# Patient Record
Sex: Male | Born: 1985 | Race: Black or African American | Hispanic: No | Marital: Single | Smoking: Never smoker
Health system: Southern US, Community
[De-identification: ages and names within clinical notes are randomized; demographics above are authoritative.]

## PROBLEM LIST (undated history)

## (undated) ENCOUNTER — Emergency Department (HOSPITAL_COMMUNITY): Payer: Self-pay | Source: Home / Self Care

## (undated) DIAGNOSIS — J45909 Unspecified asthma, uncomplicated: Secondary | ICD-10-CM

---

## 2018-02-01 ENCOUNTER — Other Ambulatory Visit: Payer: Self-pay

## 2018-02-01 ENCOUNTER — Emergency Department (HOSPITAL_COMMUNITY): Payer: Self-pay

## 2018-02-01 ENCOUNTER — Emergency Department (HOSPITAL_COMMUNITY)
Admission: EM | Admit: 2018-02-01 | Discharge: 2018-02-01 | Disposition: A | Discharge status: Home / Self Care | Payer: Self-pay | Attending: Emergency Medicine | Admitting: Emergency Medicine

## 2018-02-01 ENCOUNTER — Encounter (HOSPITAL_COMMUNITY): Payer: Self-pay | Admitting: Emergency Medicine

## 2018-02-01 DIAGNOSIS — K047 Periapical abscess without sinus: Secondary | ICD-10-CM | POA: Insufficient documentation

## 2018-02-01 DIAGNOSIS — J45909 Unspecified asthma, uncomplicated: Secondary | ICD-10-CM | POA: Insufficient documentation

## 2018-02-01 DIAGNOSIS — R6884 Jaw pain: Secondary | ICD-10-CM

## 2018-02-01 DIAGNOSIS — M542 Cervicalgia: Secondary | ICD-10-CM

## 2018-02-01 HISTORY — DX: Unspecified asthma, uncomplicated: J45.909

## 2018-02-01 LAB — CBC WITH DIFFERENTIAL/PLATELET
Abs Immature Granulocytes: 0.02 10*3/uL (ref 0.00–0.07)
Basophils Absolute: 0 10*3/uL (ref 0.0–0.1)
Basophils Relative: 1 %
Eosinophils Absolute: 0.2 10*3/uL (ref 0.0–0.5)
Eosinophils Relative: 3 %
HCT: 43.9 % (ref 39.0–52.0)
Hemoglobin: 14.7 g/dL (ref 13.0–17.0)
Immature Granulocytes: 0 %
Lymphocytes Relative: 20 %
Lymphs Abs: 1.2 10*3/uL (ref 0.7–4.0)
MCH: 26.7 pg (ref 26.0–34.0)
MCHC: 33.5 g/dL (ref 30.0–36.0)
MCV: 79.8 fL — ABNORMAL LOW (ref 80.0–100.0)
Monocytes Absolute: 0.6 10*3/uL (ref 0.1–1.0)
Monocytes Relative: 9 %
Neutro Abs: 4.1 10*3/uL (ref 1.7–7.7)
Neutrophils Relative %: 67 %
Platelets: 210 10*3/uL (ref 150–400)
RBC: 5.5 MIL/uL (ref 4.22–5.81)
RDW: 11.9 % (ref 11.5–15.5)
WBC: 6.1 10*3/uL (ref 4.0–10.5)
nRBC: 0 % (ref 0.0–0.2)

## 2018-02-01 LAB — BASIC METABOLIC PANEL
Anion gap: 10 (ref 5–15)
BUN: 23 mg/dL — ABNORMAL HIGH (ref 6–20)
CO2: 27 mmol/L (ref 22–32)
Calcium: 9.4 mg/dL (ref 8.9–10.3)
Chloride: 105 mmol/L (ref 98–111)
Creatinine, Ser: 1.13 mg/dL (ref 0.61–1.24)
GFR calc Af Amer: >60 mL/min (ref >60)
GFR calc non Af Amer: >60 mL/min (ref >60)
Glucose, Bld: 103 mg/dL — ABNORMAL HIGH (ref 70–99)
Potassium: 4.6 mmol/L (ref 3.5–5.1)
Sodium: 142 mmol/L (ref 135–145)

## 2018-02-01 MED ORDER — SODIUM CHLORIDE 0.9 % IV BOLUS
1000.0000 mL | Freq: Once | INTRAVENOUS | Status: AC
  Administered 2018-02-01: 1000 mL via INTRAVENOUS

## 2018-02-01 MED ORDER — PENICILLIN V POTASSIUM 500 MG PO TABS: 500.0000 mg | ORAL_TABLET | Freq: Four times a day (QID) | ORAL | 0 refills | Status: AC

## 2018-02-01 MED ORDER — GLUCAGON HCL RDNA (DIAGNOSTIC) 1 MG IJ SOLR
1.0000 mg | Freq: Once | INTRAMUSCULAR | Status: AC
  Administered 2018-02-01: 1 mg via INTRAVENOUS
  Filled 2018-02-01: qty 1

## 2018-02-01 MED ORDER — IOHEXOL 300 MG/ML  SOLN
75.0000 mL | Freq: Once | INTRAMUSCULAR | Status: AC | PRN
  Administered 2018-02-01: 75 mL via INTRAVENOUS

## 2018-02-01 NOTE — ED Provider Notes (Addendum)
[facility] Provider Note   CSN: [id number] Arrival date & time: 02/01/18  0044     History   Chief Complaint Chief Complaint  Patient presents with  . Jaw Pain    HPI Jared Meza is a 32 y.o. male presenting today for acute onset right jaw and neck pain.  Patient states that approximately 12:30 AM he was eating vegetables when he had sudden onset of severe sharp pain.  Patient states that the pain starts in the right side of his jaw and radiates down his neck.  He states has been constant since time of onset and is worsened with turning his head, palpation of the jaw/neck and swallowing.  Patient denies shortness of breath, rash, vision changes, headache, injury/trauma, nausea/vomiting, new foods/exposure or additional concerns this time.  HPI  Past Medical History:  Diagnosis Date  . Asthma     There are no active problems to display for this patient.   History reviewed. No pertinent surgical history.      Home Medications    Prior to Admission medications   Medication Sig Start Date End Date Taking? Authorizing Provider  penicillin v potassium (VEETID) 500 MG tablet Take 1 tablet (500 mg total) by mouth 4 (four) times daily for 7 days. 02/01/18 02/08/18  Deliah Boston, PA-C    Family History No family history on file.  Social History Social History   Tobacco Use  . Smoking status: Never Smoker  . Smokeless tobacco: Never Used  Substance Use Topics  . Alcohol use: Yes    Comment: occasional  . Drug use: Never     Allergies   Patient has no known allergies.   Review of Systems Review of Systems  Constitutional: Negative.  Negative for chills and fever.  HENT: Positive for trouble swallowing. Negative for drooling, rhinorrhea and sore throat.   Eyes: Negative.  Negative for pain and visual disturbance.  Respiratory: Negative.  Negative for cough, choking and shortness of breath.   Cardiovascular:  Negative.  Negative for chest pain.  Gastrointestinal: Negative.  Negative for abdominal pain, blood in stool, diarrhea, nausea and vomiting.  Genitourinary: Negative.  Negative for dysuria and hematuria.  Musculoskeletal: Positive for neck pain. Negative for arthralgias and myalgias.  Skin: Negative.  Negative for rash.  Neurological: Negative.  Negative for dizziness, weakness and headaches.   Physical Exam Updated Vital Signs BP 120/70 (BP Location: Right Arm)   Pulse 73   Temp 98.5 F (36.9 C) (Oral)   Resp 16   Ht '5\' 7"'$  (1.702 m)   Wt 57.2 kg   SpO2 99%   BMI 19.73 kg/m   Physical Exam Constitutional:      General: He is not in acute distress.    Appearance: He is well-developed.  HENT:     Head: Normocephalic and atraumatic.     Right Ear: Hearing, tympanic membrane, ear canal and external ear normal.     Left Ear: Hearing, tympanic membrane, ear canal and external ear normal.     Nose: Nose normal.     Mouth/Throat:     Mouth: Mucous membranes are moist.     Pharynx: Oropharynx is clear. Uvula midline.     Comments: The patient has normal phonation and is in control of secretions. No stridor.  Midline uvula without edema. Soft palate rises symmetrically. No tonsillar erythema, swelling or exudates. Tongue protrusion is normal, floor of mouth is soft. No trismus. No creptius on neck palpation.  No gingival erythema or fluctuance noted. Mucus membranes moist. Eyes:     General: Vision grossly intact. Gaze aligned appropriately.     Extraocular Movements: Extraocular movements intact.     Conjunctiva/sclera: Conjunctivae normal.     Pupils: Pupils are equal, round, and reactive to light.     Comments: Visual fields grossly intact bilaterally  Neck:     Musculoskeletal: Full passive range of motion without pain, normal range of motion and neck supple.     Trachea: Trachea and phonation normal. No tracheal deviation.     Comments: Patient turning his head to the right and  endorsing pain with movement of his head to the left Cardiovascular:     Rate and Rhythm: Normal rate and regular rhythm.     Pulses: Normal pulses.          Radial pulses are 2+ on the right side and 2+ on the left side.       Dorsalis pedis pulses are 2+ on the right side and 2+ on the left side.       Posterior tibial pulses are 2+ on the right side and 2+ on the left side.     Heart sounds: Normal heart sounds.  Pulmonary:     Effort: Pulmonary effort is normal. No respiratory distress.     Breath sounds: Normal breath sounds and air entry.  Abdominal:     General: Bowel sounds are normal.     Palpations: Abdomen is soft.     Tenderness: There is no abdominal tenderness. There is no guarding or rebound.  Musculoskeletal: Normal range of motion.     Comments: No midline C/T/L spinal tenderness to palpation, no paraspinal muscle tenderness, no deformity, crepitus, or step-off noted. No sign of injury to the neck or back.  Feet:     Right foot:     Protective Sensation: 3 sites tested. 3 sites sensed.     Left foot:     Protective Sensation: 3 sites tested. 3 sites sensed.  Skin:    General: Skin is warm and dry.     Capillary Refill: Capillary refill takes less than 2 seconds.  Neurological:     General: No focal deficit present.     Mental Status: He is alert and oriented to person, place, and time.     GCS: GCS eye subscore is 4. GCS verbal subscore is 5. GCS motor subscore is 6.     Comments: Speech is clear and goal oriented, follows commands Major Cranial nerves without deficit, no facial droop Normal strength in upper and lower extremities bilaterally including dorsiflexion and plantar flexion, strong and equal grip strength Sensation normal to light touch Moves extremities without ataxia, coordination intact Normal finger to nose and rapid alternating movements Neg romberg, no pronator drift Normal gait  Psychiatric:        Mood and Affect: Mood normal.         Behavior: Behavior normal.    ED Treatments / Results  Labs (all labs ordered are listed, but only abnormal results are displayed) Labs Reviewed  CBC WITH DIFFERENTIAL/PLATELET - Abnormal; Notable for the following components:      Result Value   MCV 79.8 (*)    All other components within normal limits  BASIC METABOLIC PANEL - Abnormal; Notable for the following components:   Glucose, Bld 103 (*)    BUN 23 (*)    All other components within normal limits    EKG None  Radiology Ct Soft Tissue Neck W Contrast  Result Date: 02/01/2018 CLINICAL DATA:  RIGHT dental pain, RIGHT neck pain and RIGHT shoulder numbness. Dysphagia. EXAM: CT NECK WITH CONTRAST TECHNIQUE: Multidetector CT imaging of the neck was performed using the standard protocol following the bolus administration of intravenous contrast. CONTRAST:  34m OMNIPAQUE IOHEXOL 300 MG/ML  SOLN COMPARISON:  None. FINDINGS: PHARYNX AND LARYNX: Normal.  Widely patent airway. SALIVARY GLANDS: Normal. THYROID: Normal. LYMPH NODES: No lymphadenopathy by CT size criteria. VASCULAR: Normal. LIMITED INTRACRANIAL: Normal. VISUALIZED ORBITS: Normal. MASTOIDS AND VISUALIZED PARANASAL SINUSES: Well-aerated. SKELETON: Nonacute. Tooth 30 dental carie and early periapical abscess without drainable fluid collection. UPPER CHEST: Lung apices are clear. No superior mediastinal lymphadenopathy. OTHER: None. IMPRESSION: 1. Tooth 30 dental carie and early periapical abscess without focal fluid collection. 2. No acute process in the neck. Electronically Signed   By: CElon AlasM.D.   On: 02/01/2018 03:38   Procedures Procedures (including critical care time)  Medications Ordered in ED Medications  glucagon (human recombinant) (GLUCAGEN) injection 1 mg (1 mg Intravenous Given 02/01/18 0242)  sodium chloride 0.9 % bolus 1,000 mL (0 mLs Intravenous Stopped 02/01/18 0434)  iohexol (OMNIPAQUE) 300 MG/ML solution 75 mL (75 mLs Intravenous Contrast Given  02/01/18 0313)    Initial Impression / Assessment and Plan / ED Course  I have reviewed the triage vital signs and the nursing notes.  Pertinent labs & imaging results that were available during my care of the patient were reviewed by me and considered in my medical decision making (see chart for details).     32year old male presenting for right jaw and neck pain that began after eating food today.  CBC nonacute BMP nonacute CT nonacute Given glucagon, fluids and allowed to rest, on reevaluation patient states complete resolution of symptoms.  Patient sleeping comfortably, easily arousable and in no acute distress.  Patient is able to eat and drink in emergency department without difficulty.  At this time there does not appear to be any evidence of an acute emergency medical condition and the patient appears stable for discharge with appropriate outpatient follow up. Diagnosis was discussed with patient who verbalizes understanding of care plan and is agreeable to discharge. I have discussed return precautions with patient and family at bedside who verbalize understanding of return precautions. Patient strongly encouraged to follow-up with their PCP this week. All questions answered.  Patient's case discussed with Dr. GRoxanne Minswho agrees with plan to discharge with follow-up.  ------------------------------------- ------------------------------------- After discharge it came to my attention that patient has early periapical abscess at tooth 30 without focal fluid collection on the CT scan.  This was not the patient's area of complaint and there was no gingival erythema, swelling or fluctuance on examination today.  Multiple attempts were made to contact patient via his phone number left in his chart unsuccessfully, he does not have a voicemail box set up. ([phone] I attempted to call in a prescription for the patient however he does not have a pharmacy set up in his chart for me to  call penicillin VK to. I used the demographic tab to contact patient's mother, Violet, and informed her to let him know that he has an antibiotic that has been prescribed, she states understanding.  Discussed with Dr. GRoxanne Minsthe situation.  Note: Portions of this report may have been transcribed using voice recognition software. Every effort was made to ensure accuracy; however, inadvertent computerized transcription errors may still be present. --------------------------------------------------- 02/01/2018-  10:00 PM addendum Attempted to reach patient again today unsuccessfully, used multiple phone numbers found on patient's demographic sheet without success, no number had voicemail box set up. Examination yesterday was not consistent with dental abscess and there were no signs of Ludwig's angina, retropharyngeal abscess, peritonsillar abscess or other deep tissue infections of the head/neck.  It was thought that patient's presentation was consistent with possible food impaction vs muscle spasm which resolved during the visit and he felt well prior to discharge. I did discuss with patient's mother last night of need for patient to follow-up with dentist for his possible dental abscess.    Note: Portions of this report may have been transcribed using voice recognition software. Every effort was made to ensure accuracy; however, inadvertent computerized transcription errors may still be present. Final Clinical Impressions(s) / ED Diagnoses   Final diagnoses:  Pain in lower jaw  Neck pain  Dental abscess    ED Discharge Orders         Ordered    penicillin v potassium (VEETID) 500 MG tablet  4 times daily     02/01/18 [id number]           Deliah Boston, PA-C 02/01/18 0722    Deliah Boston, PA-C 02/01/18 2212    Deliah Boston, PA-C [id number]    Delora Fuel, MD [id number]    Delora Fuel, MD [id number]

## 2018-02-01 NOTE — ED Notes (Signed)
Pt alert and oriented x4. Pt reports difficult to swallow. Pt able to talk in full complete sentences with clear speech. Pt able to manage secretions. Pending results.

## 2018-02-01 NOTE — Discharge Instructions (Signed)
You have been diagnosed today with Pain in the lower jaw and neck.  At this time there does not appear to be the presence of an emergent medical condition, however there is always the potential for conditions to change. Please read and follow the below instructions.  Please return to the Emergency Department immediately for any new or worsening symptoms. Please be sure to follow up with your Primary Care Provider this week regarding your visit today; please call their office to schedule an appointment even if you are feeling better for a follow-up visit.  Get help right away if: You have a fever. You have pain in your chest or your abdomen. You cough up blood. You have blood in your stool (feces) or your vomit. You have trouble swallowing food. Get help right away if you: Develop a fever after choking stops. Have problems breathing after choking stops. Were given the Heimlich maneuver.   Please read the additional information packets attached to your discharge summary.  Do not take your medicine if  develop an itchy rash, swelling in your mouth or lips, or difficulty breathing.

## 2018-02-01 NOTE — ED Triage Notes (Signed)
Pt c/o R jaw pain radiating down R neck x 1 hour, onset while eating vegetables. Pt reports he feels like its difficult to swallow. Pain 10/10. Throat does not appear red/swollen

## 2019-12-01 IMAGING — CT CT NECK W/ CM
4 of 5 series · 15 of 33 positions shown, 17 images · IV contrast (Omni 300)
Comparison: None.

CLINICAL DATA: RIGHT dental pain, RIGHT neck pain and RIGHT
shoulder numbness. Dysphagia.

EXAM:
CT NECK WITH CONTRAST
TECHNIQUE: Multidetector CT imaging of the neck was performed using the
standard protocol following the bolus administration of intravenous
contrast.
CONTRAST:  75mL OMNIPAQUE IOHEXOL 300 MG/ML  SOLN

[Series 3: neck 2.0 st · axial · 0.55mm/px · z∈[-260,-106]mm · 4 of 129 slices shown, 5 images (1 of 3)]
[im 26/129  soft-tissue]
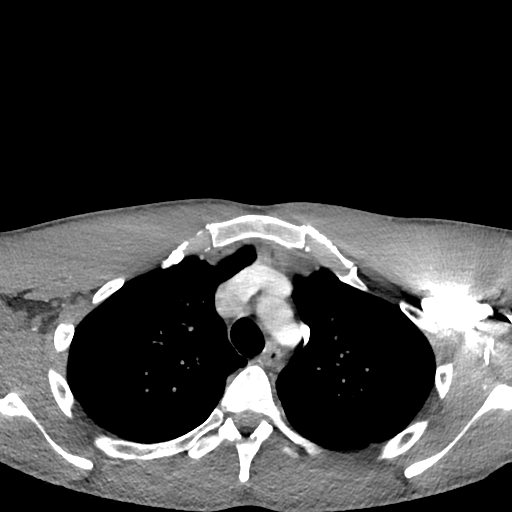
[im 26/129  bone]
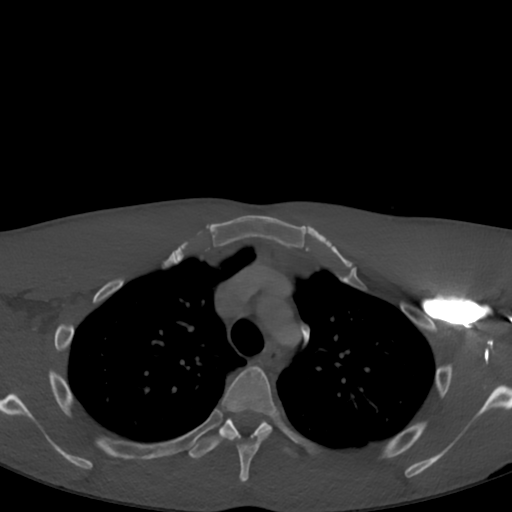
[im 52/129  bone]
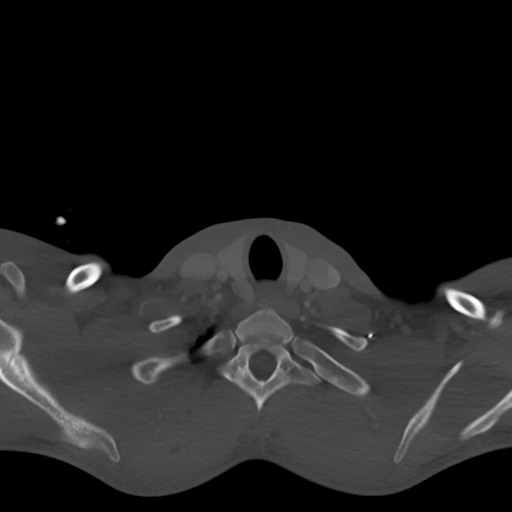
[im 77/129  bone]
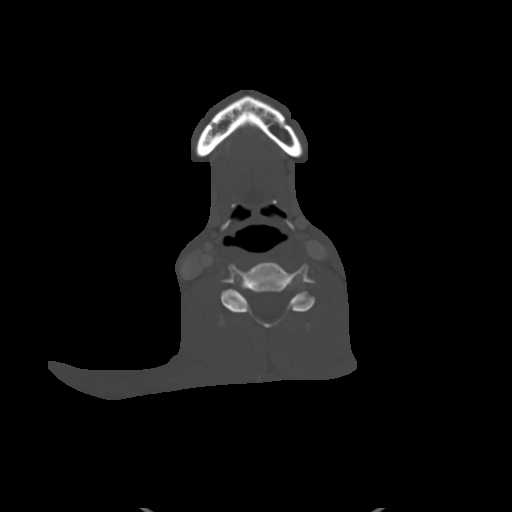
[im 103/129  bone]
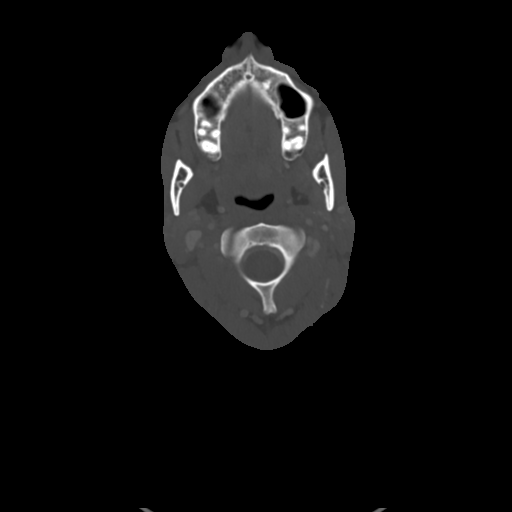

[Series 5: neck 2.0 st · sagittal · 0.50mm/px · 5 of 101 slices shown, 6 images (2 of 3)]
[im 34/101  bone]
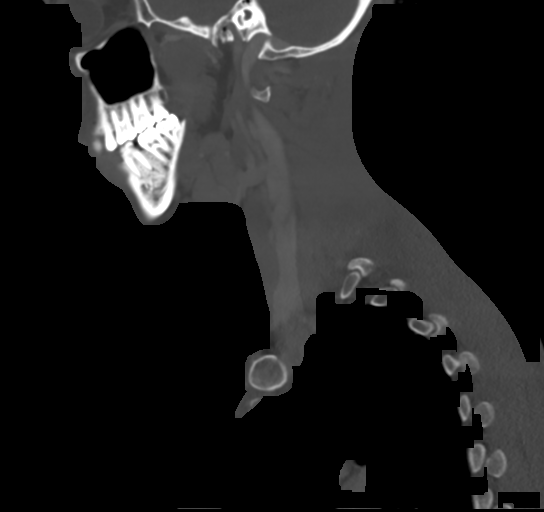
[im 42/101  bone]
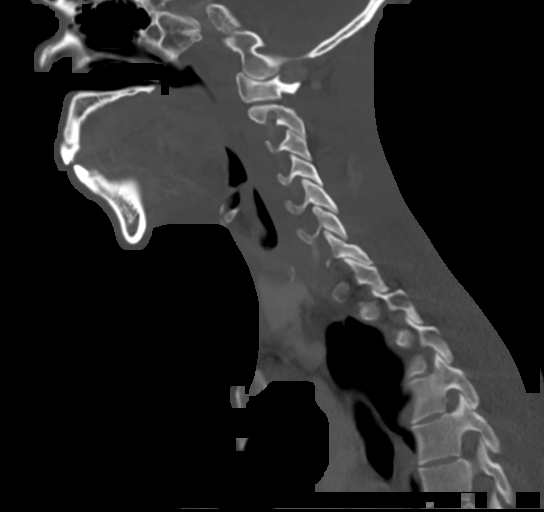
[im 51/101  soft-tissue]
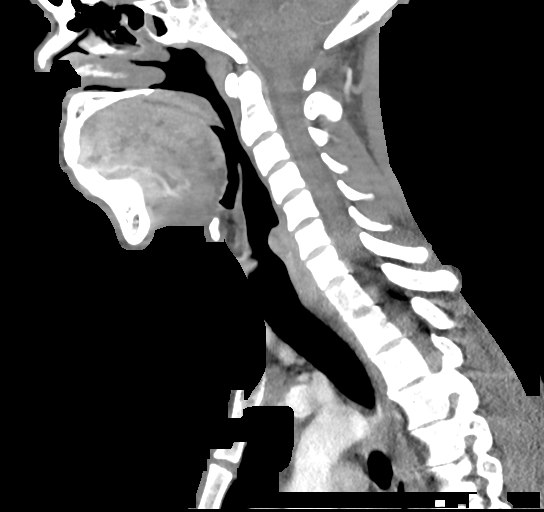
[im 51/101  bone]
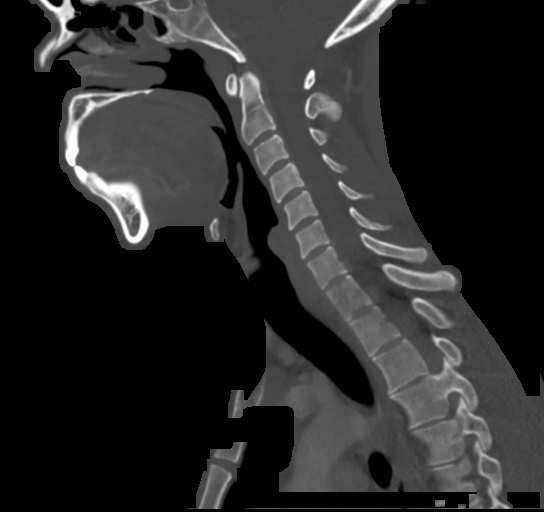
[im 59/101  bone]
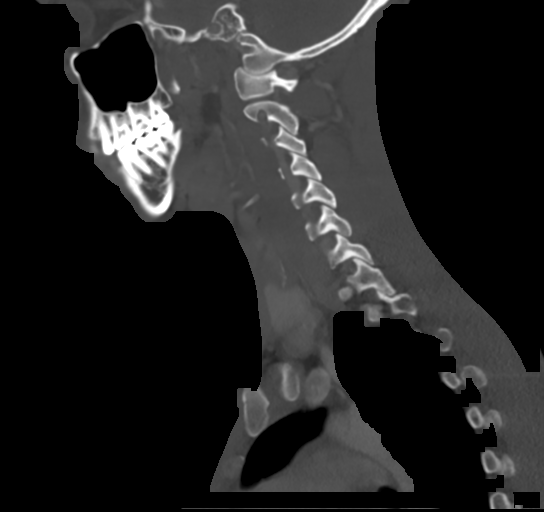
[im 67/101  bone]
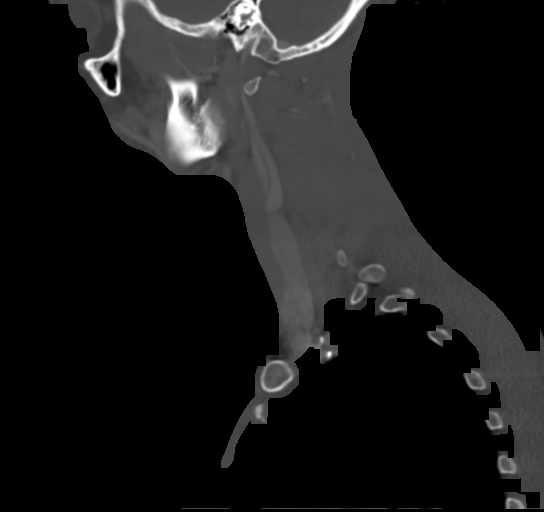

[Series 6: neck 2.0 st · coronal · 0.42mm/px · 3 of 120 slices shown (3 of 3)]
[im 24/120  bone]
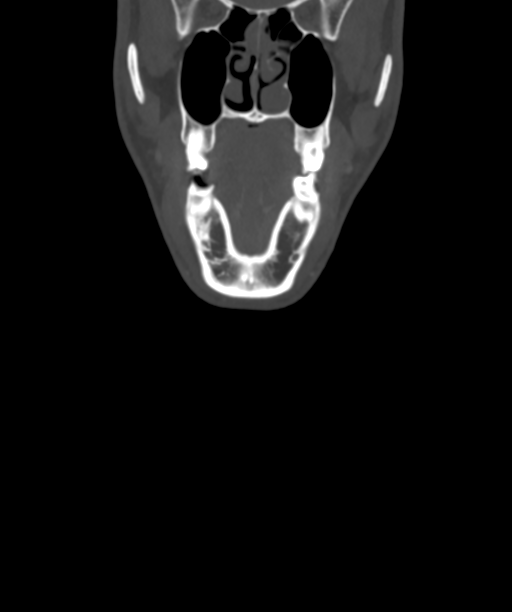
[im 48/120  bone]
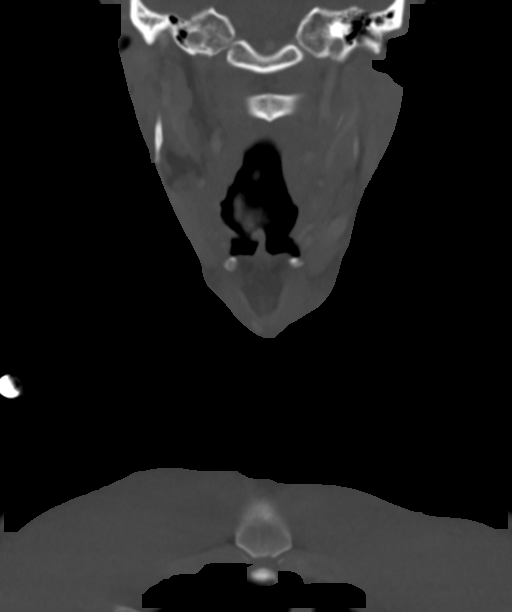
[im 72/120  bone]
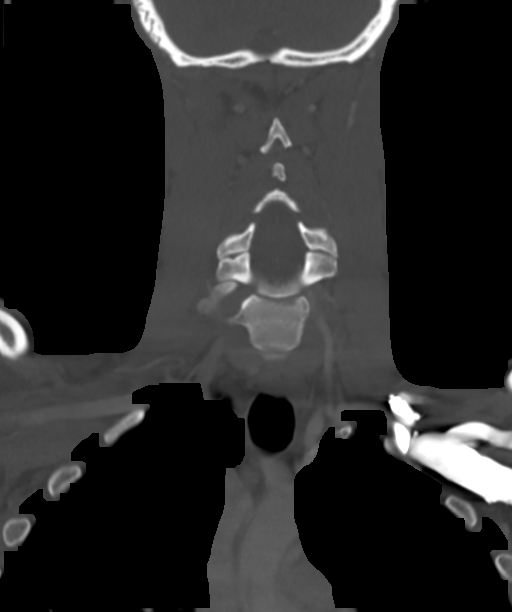

[Series 7: neck 2.0 st orthogonal · axial · 0.39mm/px · z∈[-244,-116]mm · 3 of 128 slices shown]
[im 32/128  bone]
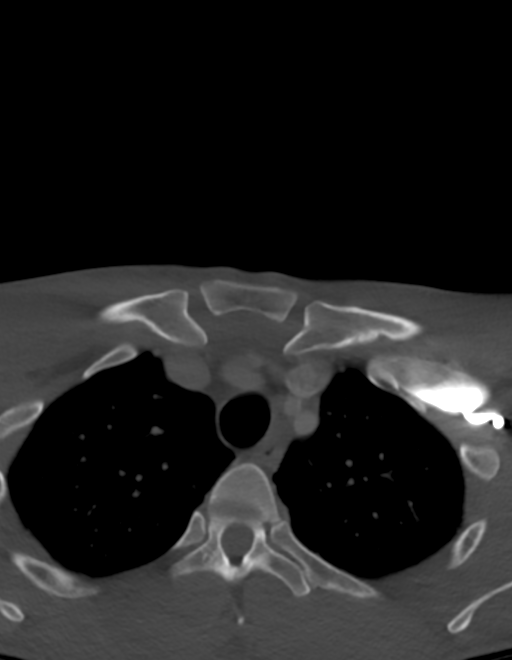
[im 64/128  bone]
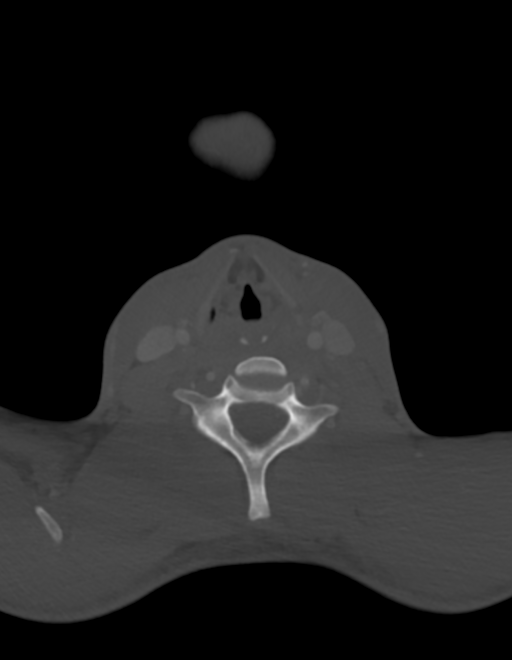
[im 96/128  bone]
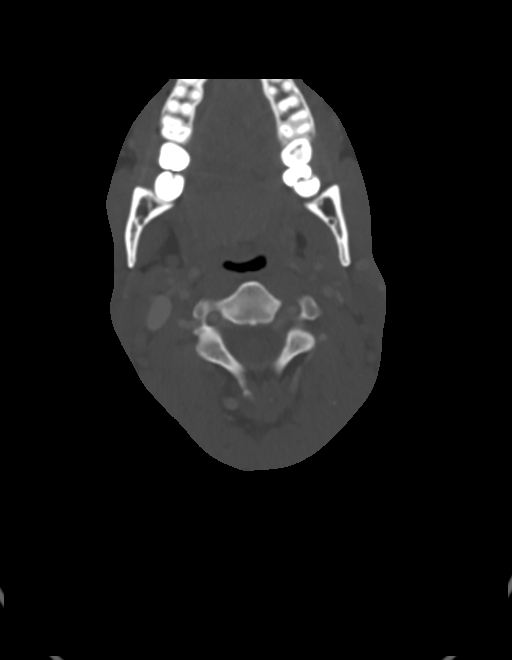

[15 of 33 positions shown; findings below may reference images not displayed]

FINDINGS: PHARYNX AND LARYNX: Normal.  Widely patent airway.

SALIVARY GLANDS: Normal.

THYROID: Normal.

LYMPH NODES: No lymphadenopathy by CT size criteria.

VASCULAR: Normal.

LIMITED INTRACRANIAL: Normal.

VISUALIZED ORBITS: Normal.

MASTOIDS AND VISUALIZED PARANASAL SINUSES: Well-aerated.

SKELETON: Nonacute. Tooth 30 dental Nass and early periapical
abscess without drainable fluid collection.

UPPER CHEST: Lung apices are clear. No superior mediastinal
lymphadenopathy.

OTHER: None.
IMPRESSION: 1. Tooth 30 dental Nass and early periapical abscess without focal
fluid collection.
2. No acute process in the neck.

## 2022-04-14 ENCOUNTER — Ambulatory Visit
Admission: RE | Admit: 2022-04-14 | Discharge: 2022-04-14 | Disposition: A | Discharge status: Home / Self Care | Payer: No Typology Code available for payment source | Source: Ambulatory Visit | Attending: Family Medicine | Admitting: Family Medicine

## 2022-04-14 ENCOUNTER — Other Ambulatory Visit: Payer: Self-pay | Admitting: Family Medicine

## 2022-04-14 DIAGNOSIS — M25512 Pain in left shoulder: Secondary | ICD-10-CM

## 2022-07-16 ENCOUNTER — Emergency Department (HOSPITAL_BASED_OUTPATIENT_CLINIC_OR_DEPARTMENT_OTHER): Payer: Commercial Managed Care - HMO | Admitting: Radiology

## 2022-07-16 ENCOUNTER — Other Ambulatory Visit: Payer: Self-pay

## 2022-07-16 ENCOUNTER — Encounter (HOSPITAL_BASED_OUTPATIENT_CLINIC_OR_DEPARTMENT_OTHER): Payer: Self-pay | Admitting: *Deleted

## 2022-07-16 ENCOUNTER — Other Ambulatory Visit (HOSPITAL_BASED_OUTPATIENT_CLINIC_OR_DEPARTMENT_OTHER): Payer: Self-pay

## 2022-07-16 ENCOUNTER — Emergency Department (HOSPITAL_BASED_OUTPATIENT_CLINIC_OR_DEPARTMENT_OTHER)
Admission: EM | Admit: 2022-07-16 | Discharge: 2022-07-16 | Disposition: A | Discharge status: Home / Self Care | Payer: Commercial Managed Care - HMO | Attending: Emergency Medicine | Admitting: Emergency Medicine

## 2022-07-16 DIAGNOSIS — J45909 Unspecified asthma, uncomplicated: Secondary | ICD-10-CM | POA: Diagnosis not present

## 2022-07-16 DIAGNOSIS — I300 Acute nonspecific idiopathic pericarditis: Secondary | ICD-10-CM | POA: Diagnosis not present

## 2022-07-16 DIAGNOSIS — R079 Chest pain, unspecified: Secondary | ICD-10-CM | POA: Diagnosis present

## 2022-07-16 LAB — BASIC METABOLIC PANEL
Anion gap: 12 (ref 5–15)
BUN: 23 mg/dL — ABNORMAL HIGH (ref 6–20)
CO2: 24 mmol/L (ref 22–32)
Calcium: 9.9 mg/dL (ref 8.9–10.3)
Chloride: 104 mmol/L (ref 98–111)
Creatinine, Ser: 1.11 mg/dL (ref 0.61–1.24)
GFR, Estimated: >60 mL/min (ref >60)
Glucose, Bld: 88 mg/dL (ref 70–99)
Potassium: 4.3 mmol/L (ref 3.5–5.1)
Sodium: 140 mmol/L (ref 135–145)

## 2022-07-16 LAB — CBC
HCT: 47.2 % (ref 39.0–52.0)
Hemoglobin: 16.5 g/dL (ref 13.0–17.0)
MCH: 27.1 pg (ref 26.0–34.0)
MCHC: 35 g/dL (ref 30.0–36.0)
MCV: 77.6 fL — ABNORMAL LOW (ref 80.0–100.0)
Platelets: 227 10*3/uL (ref 150–400)
RBC: 6.08 MIL/uL — ABNORMAL HIGH (ref 4.22–5.81)
RDW: 11.9 % (ref 11.5–15.5)
WBC: 9.2 10*3/uL (ref 4.0–10.5)
nRBC: 0 % (ref 0.0–0.2)

## 2022-07-16 LAB — TROPONIN I (HIGH SENSITIVITY): Troponin I (High Sensitivity): 3 ng/L (ref <18)

## 2022-07-16 MED ORDER — KETOROLAC TROMETHAMINE 15 MG/ML IJ SOLN
15.0000 mg | Freq: Once | INTRAMUSCULAR | Status: AC
  Administered 2022-07-16: 15 mg via INTRAMUSCULAR
  Filled 2022-07-16: qty 1

## 2022-07-16 MED ORDER — IBUPROFEN 600 MG PO TABS
600.0000 mg | ORAL_TABLET | Freq: Four times a day (QID) | ORAL | 0 refills | Status: DC
  Filled 2022-07-16: qty 28, 7d supply, fill #0

## 2022-07-16 NOTE — Discharge Instructions (Addendum)
We evaluated you for your chest pain.  Your testing including your cardiac enzymes and chest x-ray were reassuring.  I think the most likely cause of your chest pain is a condition called pericarditis.  I would like you to follow-up with a cardiologist.  I placed a cardiology referral.  I prescribed you a medicine called ibuprofen which is an anti-inflammatory medicine.  Please take this 4 times daily for 7 days.  Cardiology team should call you in 24 to 72 hours.  Your history does not fully match pericarditis but we did not find any other dangerous condition, and we think it is safe to go home.  Keep a close eye on your symptoms.  If you develop any new symptoms such as leg swelling, difficulty breathing, worsening or severe pain, fainting or lightheadedness, high fevers, or any other new symptoms, please come to the emergency department for reassessment.

## 2022-07-16 NOTE — ED Provider Notes (Signed)
[place] EMERGENCY DEPARTMENT AT [facility] Provider Note  CSN: [id number] Arrival date & time: 07/16/22 1419  Chief Complaint(s) Chest Pain  HPI Jared Meza is a 37 y.o. male with history of asthma presenting to the emergency department with chest pain.  He reports chest pain located substernally.  Reports it is a pressure and sharp.  Reports that worsens with leaning forward and improves on sitting up, worse leaning back.  He reports is mildly pleuritic.  No recent travel or surgeries.  No history of blood clots.  No leg pain or leg swelling.  No cough, fevers or chills.  Has not taken anything for symptoms so far.  No lightheadedness or dizziness.  No syncope.  No nausea or vomiting, no abdominal pain.  No diarrhea.   Past Medical History Past Medical History:  Diagnosis Date   Asthma    There are no problems to display for this patient.  Home Medication(s) Prior to Admission medications   Medication Sig Start Date End Date Taking? Authorizing Provider  ibuprofen (ADVIL) 600 MG tablet Take 1 tablet (600 mg total) by mouth 4 (four) times daily for 7 days. 07/16/22 07/23/22 Yes Lonell Grandchild, MD                                                                                                                                    Past Surgical History History reviewed. No pertinent surgical history. Family History History reviewed. No pertinent family history.  Social History Social History   Tobacco Use   Smoking status: Never   Smokeless tobacco: Never  Substance Use Topics   Alcohol use: Yes    Comment: occasional   Drug use: Never   Allergies Patient has no known allergies.  Review of Systems Review of Systems  All other systems reviewed and are negative.   Physical Exam Vital Signs  I have reviewed the triage vital signs BP 125/85   Pulse 81   Temp 99 F (37.2 C) (Oral)   Resp 14   SpO2 97%  Physical Exam Vitals and nursing note reviewed.   Constitutional:      General: He is not in acute distress.    Appearance: Normal appearance.  HENT:     Mouth/Throat:     Mouth: Mucous membranes are moist.  Eyes:     Conjunctiva/sclera: Conjunctivae normal.  Cardiovascular:     Rate and Rhythm: Normal rate and regular rhythm.  Pulmonary:     Effort: Pulmonary effort is normal. No respiratory distress.     Breath sounds: Normal breath sounds.  Chest:     Chest wall: Tenderness (minimal) present.  Abdominal:     General: Abdomen is flat.     Palpations: Abdomen is soft.     Tenderness: There is no abdominal tenderness.  Musculoskeletal:     Right lower leg: No edema.     Left lower leg: No edema.  Skin:    General: Skin is warm and dry.     Capillary Refill: Capillary refill takes less than 2 seconds.  Neurological:     Mental Status: He is alert and oriented to person, place, and time. Mental status is at baseline.  Psychiatric:        Mood and Affect: Mood normal.        Behavior: Behavior normal.     ED Results and Treatments Labs (all labs ordered are listed, but only abnormal results are displayed) Labs Reviewed  BASIC METABOLIC PANEL - Abnormal; Notable for the following components:      Result Value   BUN 23 (*)    All other components within normal limits  CBC - Abnormal; Notable for the following components:   RBC 6.08 (*)    MCV 77.6 (*)    All other components within normal limits  TROPONIN I (HIGH SENSITIVITY)                                                                                                                          Radiology DG Chest 2 View  Result Date: 07/16/2022 CLINICAL DATA:  CP and sob EXAM: CHEST - 2 VIEW COMPARISON:  None Available. FINDINGS: The heart size and mediastinal contours are within normal limits. Both lungs are clear. The visualized skeletal structures are unremarkable. IMPRESSION: No active cardiopulmonary disease. Electronically Signed   By: Lorenza Cambridge M.D.   On:  07/16/2022 15:37    Pertinent labs & imaging results that were available during my care of the patient were reviewed by me and considered in my medical decision making (see MDM for details).  Medications Ordered in ED Medications  ketorolac (TORADOL) 15 MG/ML injection 15 mg (15 mg Intramuscular Given 07/16/22 1559)                                                                                                                                     Procedures Ultrasound ED Echo  Date/Time: 07/16/2022 3:54 PM  Performed by: Lonell Grandchild, MD Authorized by: Lonell Grandchild, MD   Procedure details:    Indications: chest pain     Views: subxiphoid, parasternal long axis view, parasternal short axis view, apical 4 chamber view and IVC view     Images: archived   Findings:    Pericardium: no pericardial effusion  LV Function: normal (>50% EF)     RV Diameter: normal     IVC: normal   Impression:    Impression: normal     (including critical care time)  Medical Decision Making / ED Course   MDM:  37 year old male presenting with chest pain.  Patient overall well-appearing, physical exam with mild reproducible chest wall tenderness.  Differential includes musculoskeletal pain, possibly pericarditis, EKG does have some suggestive changes, no pericardial effusion on echocardiogram, symptoms are positional although paradoxically gets worse with leaning forward.  Will treat with anti-inflammatory.  Doubt ACS given age and atypical symptoms.  Low concern for pulmonary embolism without risk factors, positional nature of symptoms, no shortness of breath, patient PERC negative, no hypoxia or tachycardia, signs of DVT or history of DVT.  Chest x-ray clear with no pneumothorax or pneumonia.  Will reassess.  Clinical Course as of 07/16/22 1639  Wed Jul 16, 2022  1636 Patient reports his symptoms did improve some with Toradol.  Will prescribe ibuprofen.  Given positional nature and EKG  with some PR depression, concern for possible pericarditis, history does not fully match but will start on ibuprofen and have patient follow-up with cardiology.  Differential also includes musculoskeletal pain. Will discharge patient to home. All questions answered. Patient comfortable with plan of discharge. Return precautions discussed with patient and specified on the after visit summary.  [WS]    Clinical Course User Index [WS] Lonell Grandchild, MD     Additional history obtained: -External records from outside source obtained and reviewed including: Chart review including previous notes, labs, imaging, consultation notes including UC note from earlier today   Lab Tests: -I ordered, reviewed, and interpreted labs.   The pertinent results include:   Labs Reviewed  BASIC METABOLIC PANEL - Abnormal; Notable for the following components:      Result Value   BUN 23 (*)    All other components within normal limits  CBC - Abnormal; Notable for the following components:   RBC 6.08 (*)    MCV 77.6 (*)    All other components within normal limits  TROPONIN I (HIGH SENSITIVITY)    Notable for normal troponin  EKG   EKG Interpretation  Date/Time:  Wednesday July 16 2022 14:42:00 EDT Ventricular Rate:  79 PR Interval:  154 QRS Duration: 92 QT Interval:  362 QTC Calculation: 415 R Axis:   71 Text Interpretation: Normal sinus rhythm No previous ECGs available Confirmed by Linwood Dibbles [phone]) on 07/16/2022 2:46:08 PM         Imaging Studies ordered: I ordered imaging studies including CXR On my interpretation imaging demonstrates no acute process I independently visualized and interpreted imaging. I agree with the radiologist interpretation   Medicines ordered and prescription drug management: Meds ordered this encounter  Medications   ketorolac (TORADOL) 15 MG/ML injection 15 mg   ibuprofen (ADVIL) 600 MG tablet    Sig: Take 1 tablet (600 mg total) by mouth 4 (four)  times daily for 7 days.    Dispense:  28 tablet    Refill:  0    -I have reviewed the patients home medicines and have made adjustments as needed   Reevaluation: After the interventions noted above, I reevaluated the patient and found that their symptoms have improved  Co morbidities that complicate the patient evaluation  Past Medical History:  Diagnosis Date   Asthma       Dispostion: Disposition decision including need for hospitalization was considered, and  patient discharged from emergency department.    Final Clinical Impression(s) / ED Diagnoses Final diagnoses:  Acute idiopathic pericarditis     This chart was dictated using voice recognition software.  Despite best efforts to proofread,  errors can occur which can change the documentation meaning.    Lonell Grandchild, MD 07/16/22 [phone]

## 2022-07-16 NOTE — ED Triage Notes (Signed)
Pt was seen at [facility] due to CP x2 days which feels like pressure as well as sharp pains with deep breathing.  Pt has had some CP with this.

## 2022-07-18 ENCOUNTER — Ambulatory Visit: Payer: Commercial Managed Care - HMO | Attending: Internal Medicine | Admitting: Internal Medicine

## 2022-07-18 ENCOUNTER — Encounter: Payer: Self-pay | Admitting: Internal Medicine

## 2022-07-18 VITALS — BP 136/84 | HR 76 | Ht 67.0 in | Wt 155.0 lb

## 2022-07-18 DIAGNOSIS — R0782 Intercostal pain: Secondary | ICD-10-CM | POA: Diagnosis not present

## 2022-07-18 NOTE — Patient Instructions (Signed)
Medication Instructions:  No Changes In Medications at this time.  *If you need a refill on your cardiac medications before your next appointment, please call your pharmacy*   Lab Work: None Ordered At This Time.  If you have labs (blood work) drawn today and your tests are completely normal, you will receive your results only by: MyChart Message (if you have MyChart) OR A paper copy in the mail If you have any lab test that is abnormal or we need to change your treatment, we will call you to review the results.   Testing/Procedures: None Ordered At This Time.    Follow-Up: At [facility], you and your health needs are our priority.  As part of our continuing mission to provide you with exceptional heart care, we have created designated Provider Care Teams.  These Care Teams include your primary Cardiologist (physician) and Advanced Practice Providers (APPs -  Physician Assistants and Nurse Practitioners) who all work together to provide you with the care you need, when you need it.  We recommend signing up for the patient portal called "MyChart".  Sign up information is provided on this After Visit Summary.  MyChart is used to connect with patients for Virtual Visits (Telemedicine).  Patients are able to view lab/test results, encounter notes, upcoming appointments, etc.  Non-urgent messages can be sent to your provider as well.   To learn more about what you can do with MyChart, go to [email or web address].    Your next appointment:   AS NEEDED   Provider:   Maisie Fus, MD

## 2022-07-18 NOTE — Progress Notes (Signed)
Cardiology Office Note:    Date:  07/18/2022   ID:  Berdine Dance, DOB 1985-07-06, MRN [id number]  PCP:  Lucienne Capers, MD   [facility] Providers Cardiologist:  Maisie Fus, MD     Referring MD: Lonell Grandchild, MD   No chief complaint on file. Atypical CP  History of Present Illness:    Jared Meza is a 37 y.o. male with a hx of asthma, went to the ED 07/16/2022 for pleuritic chest pain. When he leans forward he can feel it but improves with lying flat. CP was reproducible  There was c/f possible pericarditis, no inflammatory markers were ordered. EKG did not show diffuse STE with PR depression. Cxray was unremarkable. Also notes aching pain in his back. He has tendinitis in his arm. No infectious. No inflammatory dx history. He was given ibuprofen, doesn't help.   Past Medical History:  Diagnosis Date   Asthma     Current Medications: Current Outpatient Medications on File Prior to Visit  Medication Sig Dispense Refill   ibuprofen (ADVIL) 600 MG tablet Take 1 tablet (600 mg total) by mouth 4 (four) times daily for 7 days. 28 tablet 0   No current facility-administered medications on file prior to visit.     Allergies:   Patient has no known allergies.   Social History   Socioeconomic History   Marital status: Single    Spouse name: Not on file   Number of children: Not on file   Years of education: Not on file   Highest education level: Not on file  Occupational History   Not on file  Tobacco Use   Smoking status: Never   Smokeless tobacco: Never  Substance and Sexual Activity   Alcohol use: Yes    Comment: occasional   Drug use: Never   Sexual activity: Not on file  Other Topics Concern   Not on file  Social History Narrative   ** Merged History Encounter **       Social Determinants of Health   Financial Resource Strain: Not on file  Food Insecurity: Not on file  Transportation Needs: Not on file  Physical Activity: Not on file  Stress:  Not on file  Social Connections: Not on file     Family History: Unknown, was adopted  ROS:   Please see the history of present illness.     All other systems reviewed and are negative.  EKGs/Labs/Other Studies Reviewed:    The following studies were reviewed today:   EKG:  EKG is  ordered today.  The ekg ordered today demonstrates     Recent Labs: 07/16/2022: BUN 23; Creatinine, Ser 1.11; Hemoglobin 16.5; Platelets 227; Potassium 4.3; Sodium 140  Recent Lipid Panel No results found for: "CHOL", "TRIG", "HDL", "CHOLHDL", "VLDL", "LDLCALC", "LDLDIRECT"   Risk Assessment/Calculations:    Physical Exam:    VS:   Vitals:   07/18/22 1148  BP: 136/84  Pulse: 76  SpO2: 95%    Wt Readings from Last 3 Encounters:  07/18/22 155 lb (70.3 kg)  02/01/18 126 lb (57.2 kg)     GEN:  Well nourished, well developed in no acute distress HEENT: Normal NECK: No JVD; No carotid bruits LYMPHATICS: No lymphadenopathy CARDIAC: RRR, no murmurs, rubs, gallops RESPIRATORY:  Clear to auscultation without rales, wheezing or rhonchi  ABDOMEN: Soft, non-tender, non-distended MUSCULOSKELETAL:  No edema; No deformity  SKIN: Warm and dry NEUROLOGIC:  Alert and oriented x 3 PSYCHIATRIC:  Normal affect  ASSESSMENT:   Non cardiac CP: no signs of pericarditis. MSK, recommend anti-inflammatories PLAN:    In order of problems listed above:  No further w/u           Medication Adjustments/Labs and Tests Ordered: Current medicines are reviewed at length with the patient today.  Concerns regarding medicines are outlined above.  No orders of the defined types were placed in this encounter.  No orders of the defined types were placed in this encounter.   Patient Instructions  Medication Instructions:  No Changes In Medications at this time.  *If you need a refill on your cardiac medications before your next appointment, please call your pharmacy*   Lab Work: None Ordered At This  Time.  If you have labs (blood work) drawn today and your tests are completely normal, you will receive your results only by: MyChart Message (if you have MyChart) OR A paper copy in the mail If you have any lab test that is abnormal or we need to change your treatment, we will call you to review the results.   Testing/Procedures: None Ordered At This Time.    Follow-Up: At [facility], you and your health needs are our priority.  As part of our continuing mission to provide you with exceptional heart care, we have created designated Provider Care Teams.  These Care Teams include your primary Cardiologist (physician) and Advanced Practice Providers (APPs -  Physician Assistants and Nurse Practitioners) who all work together to provide you with the care you need, when you need it.  We recommend signing up for the patient portal called "MyChart".  Sign up information is provided on this After Visit Summary.  MyChart is used to connect with patients for Virtual Visits (Telemedicine).  Patients are able to view lab/test results, encounter notes, upcoming appointments, etc.  Non-urgent messages can be sent to your provider as well.   To learn more about what you can do with MyChart, go to [email or web address].    Your next appointment:   AS NEEDED   Provider:   Maisie Fus, MD      Signed, Maisie Fus, MD  07/18/2022 12:05 PM    [facility]

## 2022-07-23 ENCOUNTER — Ambulatory Visit: Payer: Commercial Managed Care - HMO | Admitting: Cardiology

## 2022-07-23 ENCOUNTER — Encounter: Payer: Self-pay | Admitting: Cardiology

## 2022-07-23 VITALS — BP 142/93 | HR 65 | Ht 67.0 in | Wt 151.0 lb

## 2022-07-23 DIAGNOSIS — R072 Precordial pain: Secondary | ICD-10-CM

## 2022-07-23 NOTE — Progress Notes (Signed)
Patient referred by Lucienne Capers, MD for chest pain  Subjective:   Jared Meza, male    DOB: 25-Feb-1985, 37 y.o.   MRN: [id number]   Chief Complaint  Patient presents with   Chest Pain     HPI  37 y.o. African-American male with chest pain  Patient is here today with his wife and child. He has had retrosternal pain with radiation to back, worse with sitting up, worse with being supine. It is worse with deep breathing and lifting heavy objects, something he has to do at work. He denies any fever, cough, recently.   Patient was seen on 07/16/2022 in [place], ER for chest pain.  EKG showed mild diffuse ST elevation, suggestive of pericarditis.  Patient was given IV ketorolac, and recommended ibuprofen 600 mg up to 4 times daily for 7 days. Patient tells me that "fluid around the heart" was noted on bedside echocardiogram, although not formally noted on chart.   Pain is bette but still persists.   Past Medical History:  Diagnosis Date   Asthma      History reviewed. No pertinent surgical history.   Social History   Tobacco Use  Smoking Status Never  Smokeless Tobacco Never    Social History   Substance and Sexual Activity  Alcohol Use Yes   Comment: occasional     Family History  Adopted: Yes      Current Outpatient Medications:    cyclobenzaprine (FLEXERIL) 10 MG tablet, Take 10 mg by mouth 3 (three) times daily., Disp: , Rfl:    diclofenac Sodium (VOLTAREN) 1 % GEL, 2 g 4 (four) times daily., Disp: , Rfl:    hydrochlorothiazide (HYDRODIURIL) 25 MG tablet, Take 25 mg by mouth daily., Disp: , Rfl:    meloxicam (MOBIC) 15 MG tablet, Take 15 mg by mouth daily., Disp: , Rfl:    nitroGLYCERIN (NITRODUR - DOSED IN MG/24 HR) 0.2 mg/hr patch, Place 0.2 mg onto the skin daily., Disp: , Rfl:    ibuprofen (ADVIL) 600 MG tablet, Take 1 tablet (600 mg total) by mouth 4 (four) times daily for 7 days., Disp: 28 tablet, Rfl: 0   Cardiovascular and other pertinent  studies:  Reviewed external labs and tests, independently interpreted  EKG 07/23/2022: Sinus rhythm 69 bpm Normal EKG   Recent labs: 07/16/2022: Glucose 88, BUN/Cr 23/1.11. EGFR >60. Na/K 140/4.3.  H/H 16/47. MCV 77. Platelets 227   Review of Systems  Cardiovascular:  Positive for chest pain. Negative for dyspnea on exertion, leg swelling, palpitations and syncope.         Vitals:   07/23/22 0951  BP: (!) 142/93  Pulse: 65  SpO2: 98%     Body mass index is 23.65 kg/m. Filed Weights   07/23/22 0951  Weight: 151 lb (68.5 kg)     Objective:   Physical Exam Vitals and nursing note reviewed.  Constitutional:      General: He is not in acute distress. Neck:     Vascular: No JVD.  Cardiovascular:     Rate and Rhythm: Normal rate and regular rhythm.     Heart sounds: Normal heart sounds. No murmur heard. Pulmonary:     Effort: Pulmonary effort is normal.     Breath sounds: Normal breath sounds. No wheezing or rales.  Chest:     Comments: Chest wall tenderness        Visit diagnoses:   ICD-10-CM   1. Precordial pain  R07.2 EKG 12-Lead  PCV ECHOCARDIOGRAM COMPLETE       Orders Placed This Encounter  Procedures   EKG 12-Lead      Assessment & Recommendations:    37 y.o. African-American male with chest pain  Chest pain: Subtle diffuse ST elevation on EKG on 07/16/2022 suggestive of pericarditis, not so prominent on EKG today. That said, he also has chest wall tenderness. I will check echocardiogram. If effusion noted, will treat as pericarditis. If not, possibly musculoskeletal in etiology. Ok to take ibuprofen 200-400 mg tid with food for now regardless. Given the nature of his work, could consider absence from work for two weeks.  Further recommendations after above testing.  Thank you for referring the patient to [place]. Please feel free to contact with any questions.   Elder Negus, MD Pager: [phone] Office: [phone]

## 2022-08-13 ENCOUNTER — Ambulatory Visit: Payer: Commercial Managed Care - HMO

## 2022-08-13 DIAGNOSIS — R072 Precordial pain: Secondary | ICD-10-CM

## 2022-08-22 ENCOUNTER — Ambulatory Visit: Payer: Commercial Managed Care - HMO | Admitting: Cardiology

## 2022-09-22 ENCOUNTER — Ambulatory Visit: Payer: Commercial Managed Care - HMO | Admitting: Cardiology

## 2022-09-22 NOTE — Progress Notes (Deleted)
    Patient referred by Leilani Able, MD for chest pain  Subjective:   Jared Meza, male    DOB: 11/16/1985, 37 y.o.   MRN: [id number]   No chief complaint on file.    HPI  37 y.o. African-American male with chest pain  ***  Initial consultation visit ***: Patient is here today with his wife and child. He has had retrosternal pain with radiation to back, worse with sitting up, worse with being supine. It is worse with deep breathing and lifting heavy objects, something he has to do at work. He denies any fever, cough, recently.   Patient was seen on 07/16/2022 in [place], ER for chest pain.  EKG showed mild diffuse ST elevation, suggestive of pericarditis.  Patient was given IV ketorolac, and recommended ibuprofen 600 mg up to 4 times daily for 7 days. Patient tells me that "fluid around the heart" was noted on bedside echocardiogram, although not formally noted on chart.   Pain is bette but still persists.   *** No current outpatient medications on file.   Cardiovascular and other pertinent studies:  Reviewed external labs and tests, independently interpreted  EKG 07/23/2022: Sinus rhythm 69 bpm Normal EKG   Recent labs: 07/16/2022: Glucose 88, BUN/Cr 23/1.11. EGFR >60. Na/K 140/4.3.  H/H 16/47. MCV 77. Platelets 227   Review of Systems  Cardiovascular:  Positive for chest pain. Negative for dyspnea on exertion, leg swelling, palpitations and syncope.         There were no vitals filed for this visit.    There is no height or weight on file to calculate BMI. There were no vitals filed for this visit.    Objective:   Physical Exam Vitals and nursing note reviewed.  Constitutional:      General: He is not in acute distress. Neck:     Vascular: No JVD.  Cardiovascular:     Rate and Rhythm: Normal rate and regular rhythm.     Heart sounds: Normal heart sounds. No murmur heard. Pulmonary:     Effort: Pulmonary effort is normal.     Breath sounds: Normal  breath sounds. No wheezing or rales.  Chest:     Comments: Chest wall tenderness        Visit diagnoses: No diagnosis found.    No orders of the defined types were placed in this encounter.     Assessment & Recommendations:    37 y.o. African-American male with chest pain  Chest pain: Subtle diffuse ST elevation on EKG on 07/16/2022 suggestive of pericarditis, not so prominent on EKG today. That said, he also has chest wall tenderness. I will check echocardiogram. If effusion noted, will treat as pericarditis. If not, possibly musculoskeletal in etiology. Ok to take ibuprofen 200-400 mg tid with food for now regardless. Given the nature of his work, could consider absence from work for two weeks.  Further recommendations after above testing.  Thank you for referring the patient to [place]. Please feel free to contact with any questions.   Elder Negus, MD Pager: [phone] Office: [phone]
